# Patient Record
Sex: Female | Born: 2005
Health system: Southern US, Community
[De-identification: ages and names within clinical notes are randomized; demographics above are authoritative.]

## PROBLEM LIST (undated history)

## (undated) DIAGNOSIS — R011 Cardiac murmur, unspecified: Secondary | ICD-10-CM

---
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
## 2024-01-22 DIAGNOSIS — Z2821 Immunization not carried out because of patient refusal: Secondary | ICD-10-CM | POA: Diagnosis not present

## 2024-01-22 DIAGNOSIS — R010 Benign and innocent cardiac murmurs: Secondary | ICD-10-CM | POA: Diagnosis not present

## 2024-01-22 DIAGNOSIS — Z0000 Encounter for general adult medical examination without abnormal findings: Secondary | ICD-10-CM | POA: Diagnosis not present

## 2024-01-22 DIAGNOSIS — F32A Depression, unspecified: Secondary | ICD-10-CM | POA: Diagnosis not present

## 2024-01-22 DIAGNOSIS — Z5320 Procedure and treatment not carried out because of patient's decision for unspecified reasons: Secondary | ICD-10-CM | POA: Diagnosis not present

## 2024-01-22 DIAGNOSIS — Z131 Encounter for screening for diabetes mellitus: Secondary | ICD-10-CM | POA: Diagnosis not present

## 2024-01-22 DIAGNOSIS — Z13220 Encounter for screening for lipoid disorders: Secondary | ICD-10-CM | POA: Diagnosis not present
